# Patient Record
Sex: Female | Born: 1975 | Race: White | Hispanic: No | Marital: Married | Smoking: Never smoker
Health system: Southern US, Community
[De-identification: ages and names within clinical notes are randomized; demographics above are authoritative.]

---

## 1997-11-14 ENCOUNTER — Inpatient Hospital Stay (HOSPITAL_COMMUNITY): Admission: AD | Admit: 1997-11-14 | Discharge: 1997-11-14 | Payer: Self-pay | Admitting: *Deleted

## 1997-11-19 ENCOUNTER — Inpatient Hospital Stay (HOSPITAL_COMMUNITY): Admission: AD | Admit: 1997-11-19 | Discharge: 1997-11-19 | Payer: Self-pay | Admitting: Obstetrics & Gynecology

## 1997-11-22 ENCOUNTER — Inpatient Hospital Stay (HOSPITAL_COMMUNITY): Admission: AD | Admit: 1997-11-22 | Discharge: 1997-11-25 | Payer: Self-pay | Admitting: *Deleted

## 1997-12-31 ENCOUNTER — Ambulatory Visit (HOSPITAL_COMMUNITY): Admission: RE | Admit: 1997-12-31 | Discharge: 1997-12-31 | Payer: Self-pay | Admitting: *Deleted

## 1998-09-20 ENCOUNTER — Other Ambulatory Visit: Admission: RE | Admit: 1998-09-20 | Discharge: 1998-09-20 | Payer: Self-pay | Admitting: *Deleted

## 2000-01-15 ENCOUNTER — Other Ambulatory Visit: Admission: RE | Admit: 2000-01-15 | Discharge: 2000-01-15 | Payer: Self-pay | Admitting: *Deleted

## 2000-01-20 ENCOUNTER — Inpatient Hospital Stay (HOSPITAL_COMMUNITY): Admission: AD | Admit: 2000-01-20 | Discharge: 2000-01-20 | Payer: Self-pay | Admitting: Obstetrics and Gynecology

## 2000-03-28 ENCOUNTER — Inpatient Hospital Stay (HOSPITAL_COMMUNITY): Admission: AD | Admit: 2000-03-28 | Discharge: 2000-03-28 | Payer: Self-pay | Admitting: Obstetrics and Gynecology

## 2000-05-03 ENCOUNTER — Ambulatory Visit (HOSPITAL_COMMUNITY): Admission: RE | Admit: 2000-05-03 | Discharge: 2000-05-03 | Payer: Self-pay | Admitting: Obstetrics & Gynecology

## 2000-07-07 ENCOUNTER — Inpatient Hospital Stay (HOSPITAL_COMMUNITY): Admission: AD | Admit: 2000-07-07 | Discharge: 2000-07-07 | Payer: Self-pay | Admitting: Obstetrics and Gynecology

## 2000-07-19 ENCOUNTER — Encounter (INDEPENDENT_AMBULATORY_CARE_PROVIDER_SITE_OTHER): Payer: Self-pay | Admitting: Specialist

## 2000-07-19 ENCOUNTER — Inpatient Hospital Stay (HOSPITAL_COMMUNITY): Admission: AD | Admit: 2000-07-19 | Discharge: 2000-07-22 | Payer: Self-pay | Admitting: Obstetrics and Gynecology

## 2000-09-16 ENCOUNTER — Other Ambulatory Visit: Admission: RE | Admit: 2000-09-16 | Discharge: 2000-09-16 | Payer: Self-pay | Admitting: *Deleted

## 2003-08-23 ENCOUNTER — Other Ambulatory Visit: Admission: RE | Admit: 2003-08-23 | Discharge: 2003-08-23 | Payer: Self-pay | Admitting: *Deleted

## 2005-09-29 ENCOUNTER — Encounter: Admission: RE | Admit: 2005-09-29 | Discharge: 2005-09-29 | Payer: Self-pay | Admitting: Neurology

## 2005-10-05 ENCOUNTER — Ambulatory Visit (HOSPITAL_COMMUNITY): Admission: RE | Admit: 2005-10-05 | Discharge: 2005-10-05 | Payer: Self-pay | Admitting: Neurology

## 2005-10-10 ENCOUNTER — Encounter: Admission: RE | Admit: 2005-10-10 | Discharge: 2005-10-10 | Payer: Self-pay | Admitting: Neurology

## 2007-12-02 ENCOUNTER — Emergency Department (HOSPITAL_COMMUNITY): Admission: EM | Admit: 2007-12-02 | Discharge: 2007-12-02 | Payer: Self-pay | Admitting: Emergency Medicine

## 2010-10-13 NOTE — Op Note (Signed)
[facility]  Patient:    Laura Williamson, Laura Williamson                      MRN: [id number] Proc. Date: 07/21/00 Adm. Date:  [id number] Disc. Date: [id number] Attending:  Cordelia Pen Ii                           Operative Report  PREOPERATIVE DIAGNOSIS:       Requests permanent, voluntary sterilization.  POSTOPERATIVE DIAGNOSIS:      Requests permanent, voluntary sterilization.  OPERATION:                    Bilateral tubal ligation.  SURGEON:                      Willey Blade, M.D.  ANESTHESIA:                   General endotracheal anesthesia.  ESTIMATED BLOOD LOSS:         Less than 20 cc.  COMPLICATIONS:                None.  FINDINGS:                     At time of tubal ligation, the left tube was remarkable for previous Filshie clip placement.  The right tube was missing the Filshie clip, and this was not identified.  DESCRIPTION OF PROCEDURE:     The patient was taken to the operating room where a general endotracheal anesthesia was administered.  The patient was placed on the operating table in supine position.  The abdomen was prepped and draped in the usual sterile fashion with Betadine and sterile drapes.  The abdomen was entered through a small vertical infraumbilical skin incision and carried down sharply in the usual fashion.  The peritoneum was atraumatically entered.  Two [employer] retractors were then placed, and the left tube was identified, traced to its fimbriated end to ensure it positive identification.  The tube was then grasped in its mid portion and, through an avascular region of the medial salpinx, a Mayo needle carrying one long strand of 0 plain suture was passed atraumatically.  The #0 plain suture was then cut and an approximate 2.5 cm segment of tube was then tied off using the now two strands of #0 plain.  An approximate 1.5 cm segment of tube was then excised between the two existing plain ligatures.  On the  medial tubal stump, two interrupted sutures of 2-0 silk were placed to secure tubal ligation.  The Filshie clip was identified.  The same procedure was repeated upon the right tube, again with two ligatures of silk on the medial tubal stump.  The Filshie clip was not identified on the right tube.  Good hemostasis was noted from the tubal site.  Attention was then turned to closure.  The fascia was closed with a running stitch of 0 Vicryl.  The subcutaneous areas were closed with 0 Vicryl as well. The skin was the reapproximated with Dermabond glue.   The patient was awakened, extubated, and then taken to the recovery room in good condition. There were no perioperative complications. DD:  07/21/00 TD:  07/22/00 Job: [id number]

## 2010-10-13 NOTE — Op Note (Signed)
NAME:  Laura Williamson, Laura Williamson               ACCOUNT NO.:  [id number]   MEDICAL RECORD NO.:  [id number]          PATIENT TYPE:  OUT   LOCATION:  [facility]                          FACILITY:  [facility]   PHYSICIAN:  Michael L. Reynolds, M.D.DATE OF BIRTH:  January 05, 1976   DATE OF PROCEDURE:  10/05/2005  DATE OF DISCHARGE:                                 OPERATIVE REPORT   PROCEDURE:  Diagnostic and therapeutic lumbar puncture.   INDICATIONS:  Papilledema, suspected pseudotumor cerebri.   OPERATOR:  Michael L. Reynolds, M.D.   DESCRIPTION OF PROCEDURE:  Informed consent was obtained and the form was  signed and placed in the chart after the procedure.  The risks and benefits  were explained to the patient and she agreed to proceed.  The patient was  placed in the right lateral decubitus position and prepped and draped in the  usual sterile fashion.  Local anesthesia was achieved with 6 mL of  lidocaine.  A 20-gauge spinal needle was inserted into the L3-4 interspace  and advanced until clear CSF was obtained.  Opening pressures measured at  290 mmH2O.  A total of 20 mL of clear CSF was collected and sent for routine  laboratory analysis, including cell count, glucose and protein.  A total of  20 mL of fluid was taken off for a closing pressure measured at 125 mmH2O.  The needle was then withdrawn and hemostasis was obtained.  The patient  developed a headache during the procedure, which may have require  symptomatic treatment, but no other complications of the procedure were  noted.  She will be observed in the short stay area and will be discharged  home after supine position for an hour if stable.  The patient was given the  usual post-LP precautions and was asked to call the office or the on-call  physician over the next few days if she developed a severe symptomatic  postural headache.      Michael L. Thad Ranger, M.D.  Electronically Signed     MLR/MEDQ  D:  10/05/2005  T:  10/06/2005  Job:   [id number]

## 2019-06-12 ENCOUNTER — Emergency Department (HOSPITAL_COMMUNITY): Payer: BC Managed Care – PPO

## 2019-06-12 ENCOUNTER — Encounter (HOSPITAL_COMMUNITY): Payer: Self-pay | Admitting: Emergency Medicine

## 2019-06-12 ENCOUNTER — Other Ambulatory Visit: Payer: Self-pay

## 2019-06-12 ENCOUNTER — Emergency Department (HOSPITAL_COMMUNITY)
Admission: EM | Admit: 2019-06-12 | Discharge: 2019-06-12 | Disposition: A | Discharge status: Home / Self Care | Payer: BC Managed Care – PPO | Attending: Emergency Medicine | Admitting: Emergency Medicine

## 2019-06-12 DIAGNOSIS — Y999 Unspecified external cause status: Secondary | ICD-10-CM | POA: Insufficient documentation

## 2019-06-12 DIAGNOSIS — R0789 Other chest pain: Secondary | ICD-10-CM | POA: Insufficient documentation

## 2019-06-12 DIAGNOSIS — Y93I9 Activity, other involving external motion: Secondary | ICD-10-CM | POA: Insufficient documentation

## 2019-06-12 DIAGNOSIS — Y92410 Unspecified street and highway as the place of occurrence of the external cause: Secondary | ICD-10-CM | POA: Diagnosis not present

## 2019-06-12 DIAGNOSIS — S8011XA Contusion of right lower leg, initial encounter: Secondary | ICD-10-CM | POA: Insufficient documentation

## 2019-06-12 DIAGNOSIS — R079 Chest pain, unspecified: Secondary | ICD-10-CM

## 2019-06-12 MED ORDER — OXYCODONE-ACETAMINOPHEN 5-325 MG PO TABS
1.0000 | ORAL_TABLET | Freq: Once | ORAL | Status: AC
  Administered 2019-06-12: 19:00:00 1 via ORAL
  Filled 2019-06-12: qty 1

## 2019-06-12 MED ORDER — NAPROXEN 375 MG PO TABS: 375.0000 mg | ORAL_TABLET | Freq: Two times a day (BID) | ORAL | 0 refills | Status: AC

## 2019-06-12 MED ORDER — CYCLOBENZAPRINE HCL 10 MG PO TABS: 10.0000 mg | ORAL_TABLET | Freq: Two times a day (BID) | ORAL | 0 refills | Status: AC | PRN

## 2019-06-12 NOTE — Discharge Instructions (Addendum)
Chest x-ray and x-ray of your right lower leg were normal.  EKG normal.  I examined your breast and it looks like you have a small bruise.  Ice pack.  Prescription for anti-inflammatory and muscle relaxer.  Expect to be sore for several days.
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
